# Patient Record
Sex: Female | Born: 2000 | Hispanic: Yes | Marital: Single | Smoking: Never smoker
Health system: Southern US, Community
[De-identification: ages and names within clinical notes are randomized; demographics above are authoritative.]

## PROBLEM LIST (undated history)

## (undated) DIAGNOSIS — F32A Depression, unspecified: Secondary | ICD-10-CM

## (undated) DIAGNOSIS — F329 Major depressive disorder, single episode, unspecified: Secondary | ICD-10-CM

## (undated) HISTORY — PX: TONSILLECTOMY: SUR1361

---
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
## 2023-11-20 ENCOUNTER — Other Ambulatory Visit: Payer: Self-pay

## 2023-11-20 ENCOUNTER — Emergency Department (HOSPITAL_BASED_OUTPATIENT_CLINIC_OR_DEPARTMENT_OTHER)
Admission: EM | Admit: 2023-11-20 | Discharge: 2023-11-20 | Disposition: A | Discharge status: Home / Self Care | Payer: Self-pay | Attending: Emergency Medicine | Admitting: Emergency Medicine

## 2023-11-20 ENCOUNTER — Encounter (HOSPITAL_BASED_OUTPATIENT_CLINIC_OR_DEPARTMENT_OTHER): Payer: Self-pay | Admitting: Emergency Medicine

## 2023-11-20 ENCOUNTER — Emergency Department (HOSPITAL_BASED_OUTPATIENT_CLINIC_OR_DEPARTMENT_OTHER): Payer: Self-pay

## 2023-11-20 DIAGNOSIS — O468X1 Other antepartum hemorrhage, first trimester: Secondary | ICD-10-CM | POA: Diagnosis not present

## 2023-11-20 DIAGNOSIS — O208 Other hemorrhage in early pregnancy: Secondary | ICD-10-CM

## 2023-11-20 DIAGNOSIS — Z3A08 8 weeks gestation of pregnancy: Secondary | ICD-10-CM | POA: Insufficient documentation

## 2023-11-20 DIAGNOSIS — O209 Hemorrhage in early pregnancy, unspecified: Secondary | ICD-10-CM | POA: Diagnosis present

## 2023-11-20 LAB — URINALYSIS, ROUTINE W REFLEX MICROSCOPIC
Bilirubin Urine: NEGATIVE
Glucose, UA: NEGATIVE mg/dL
Hgb urine dipstick: NEGATIVE
Ketones, ur: NEGATIVE mg/dL
Leukocytes,Ua: NEGATIVE
Nitrite: NEGATIVE
Protein, ur: NEGATIVE mg/dL
Specific Gravity, Urine: >=1.03 (ref 1.005–1.030)
pH: 6 (ref 5.0–8.0)

## 2023-11-20 LAB — CBC WITH DIFFERENTIAL/PLATELET
Abs Immature Granulocytes: 0.04 10*3/uL (ref 0.00–0.07)
Basophils Absolute: 0 10*3/uL (ref 0.0–0.1)
Basophils Relative: 0 %
Eosinophils Absolute: 0.3 10*3/uL (ref 0.0–0.5)
Eosinophils Relative: 3 %
HCT: 38.3 % (ref 36.0–46.0)
Hemoglobin: 12.9 g/dL (ref 12.0–15.0)
Immature Granulocytes: 0 %
Lymphocytes Relative: 22 %
Lymphs Abs: 2.1 10*3/uL (ref 0.7–4.0)
MCH: 31.3 pg (ref 26.0–34.0)
MCHC: 33.7 g/dL (ref 30.0–36.0)
MCV: 93 fL (ref 80.0–100.0)
Monocytes Absolute: 0.8 10*3/uL (ref 0.1–1.0)
Monocytes Relative: 9 %
Neutro Abs: 6.3 10*3/uL (ref 1.7–7.7)
Neutrophils Relative %: 66 %
Platelets: 186 10*3/uL (ref 150–400)
RBC: 4.12 MIL/uL (ref 3.87–5.11)
RDW: 12.9 % (ref 11.5–15.5)
WBC: 9.5 10*3/uL (ref 4.0–10.5)
nRBC: 0 % (ref 0.0–0.2)

## 2023-11-20 LAB — BASIC METABOLIC PANEL
Anion gap: 7 (ref 5–15)
BUN: 13 mg/dL (ref 6–20)
CO2: 24 mmol/L (ref 22–32)
Calcium: 9.2 mg/dL (ref 8.9–10.3)
Chloride: 105 mmol/L (ref 98–111)
Creatinine, Ser: 0.64 mg/dL (ref 0.44–1.00)
GFR, Estimated: >60 mL/min (ref >60)
Glucose, Bld: 99 mg/dL (ref 70–99)
Potassium: 3.5 mmol/L (ref 3.5–5.1)
Sodium: 136 mmol/L (ref 135–145)

## 2023-11-20 LAB — HCG, QUANTITATIVE, PREGNANCY: hCG, Beta Chain, Quant, S: 123581 m[IU]/mL — ABNORMAL HIGH (ref <5)

## 2023-11-20 NOTE — ED Triage Notes (Signed)
 Vaginal spotting/bleeding started today. Patient states she is 7-[redacted] wks pregnant. G1P0000. LMP 1/12.

## 2023-11-20 NOTE — ED Provider Notes (Incomplete)
 [facility] Provider Note   CSN: [id number] Arrival date & time: 11/20/23  1920     History {Add pertinent medical, surgical, social history, OB history to HPI:1} Chief Complaint  Patient presents with   Vaginal Bleeding    Leslie Mcpherson is a 23 y.o. female.   Vaginal Bleeding      Home Medications Prior to Admission medications   Medication Sig Start Date End Date Taking? Authorizing Provider  escitalopram (LEXAPRO) 10 MG tablet Take 10 mg by mouth daily.    [provider]  methocarbamol (ROBAXIN) 500 MG tablet Take 2 tablets (1,000 mg total) by mouth 4 (four) times daily. 10/12/18   Renne Crigler, PA-C  naproxen (NAPROSYN) 500 MG tablet Take 1 tablet (500 mg total) by mouth 2 (two) times daily. 10/12/18   Renne Crigler, PA-C      Allergies    Patient has no known allergies.    Review of Systems   Review of Systems  Genitourinary:  Positive for vaginal bleeding.    Physical Exam Updated Vital Signs BP 117/73 (BP Location: Left Arm)   Pulse 81   Temp 98.1 F (36.7 C)   Resp 20   Ht 4\' 11"  (1.499 m)   Wt 79.5 kg   LMP 09/18/2023 (Exact Date)   SpO2 100%   BMI 35.39 kg/m  Physical Exam  ED Results / Procedures / Treatments   Labs (all labs ordered are listed, but only abnormal results are displayed) Labs Reviewed  HCG, QUANTITATIVE, PREGNANCY - Abnormal; Notable for the following components:      Result Value   hCG, Beta Chain, Quant, S [id number] (*)    All other components within normal limits  CBC WITH DIFFERENTIAL/PLATELET  URINALYSIS, ROUTINE W REFLEX MICROSCOPIC  BASIC METABOLIC PANEL    EKG None  Radiology US OB LESS THAN 14 WEEKS WITH OB TRANSVAGINAL Result Date: 11/20/2023 CLINICAL DATA:  Vaginal spotting and cramping. Beta HCG pending. LMP 09/18/2023 EXAM: OBSTETRIC <14 WK [place] AND TRANSVAGINAL OB US TECHNIQUE: Both transabdominal and transvaginal ultrasound examinations were performed  for complete evaluation of the gestation as well as the maternal uterus, adnexal regions, and pelvic cul-de-sac. Transvaginal technique was performed to assess early pregnancy. COMPARISON:  None Available. FINDINGS: Intrauterine gestational sac: Single Yolk sac:  Visualized. Embryo:  Visualized. Cardiac Activity: Visualized. Heart Rate: 180 bpm CRL:  16.7 mm   8 w   1 d                  [place] EDC: 06/30/2024 Subchorionic hemorrhage: Small subchorionic hemorrhage inferior to the gestational sac. Maternal uterus/adnexae: Unremarkable ovaries. No free fluid in the pelvis. IMPRESSION: Single living intrauterine gestation. No unexpected sonographic findings. Electronically Signed   By: Minerva Fester M.D.   On: 11/20/2023 20:31    Procedures Procedures  {Document cardiac monitor, telemetry assessment procedure when appropriate:1}  Medications Ordered in ED Medications - No data to display  ED Course/ Medical Decision Making/ A&P   {   Click here for ABCD2, HEART and other calculatorsREFRESH Note before signing :1}                              Medical Decision Making Amount and/or Complexity of Data Reviewed Labs: ordered. Radiology: ordered.   ***  {Document critical care time when appropriate:1} {Document review of labs and clinical decision tools ie heart score, Chads2Vasc2 etc:1}  {  Document your independent review of radiology images, and any outside records:1} {Document your discussion with family members, caretakers, and with consultants:1} {Document social determinants of health affecting pt's care:1} {Document your decision making why or why not admission, treatments were needed:1} Final Clinical Impression(s) / ED Diagnoses Final diagnoses:  None    Rx / DC Orders ED Discharge Orders     None

## 2023-11-20 NOTE — Discharge Instructions (Addendum)
 You were seen in the ER today for evaluation of your vaginal bleeding.  Your ultrasound shows that you have a subchorionic hemorrhage.  I have included more information on this into the discharge paperwork.  I need you to follow-up with your OB/GYN within the next few days.  This is extremely important.  If you start to have any worsening belly pain, increase in bleeding, fever, trouble urinating, problems moving your bowels, please return to the nearest emerged part for evaluation.  If you have any other concerns, new or worsening symptoms, please return to your nearest emergency department for reevaluation.  Contact a health care provider if: You have vaginal bleeding during any part of your pregnancy. You have cramps or labor pains. You have a fever or chills. Get help right away if: You have severe cramps in your back or abdomen. You pass large clots or a large amount of tissue from your vagina. Your bleeding increases. You feel light-headed or weak, or you faint. You are leaking fluid or have a gush of fluid from your vagina.
